# Patient Record
Sex: Male | Born: 1975 | Race: Black or African American | Hispanic: No | Marital: Married | Smoking: Never smoker
Health system: Southern US, Community
[De-identification: ages and names within clinical notes are randomized; demographics above are authoritative.]

---

## 2003-11-19 ENCOUNTER — Encounter: Admission: RE | Admit: 2003-11-19 | Discharge: 2003-11-19 | Payer: Self-pay | Admitting: Internal Medicine

## 2003-12-26 ENCOUNTER — Encounter: Admission: RE | Admit: 2003-12-26 | Discharge: 2003-12-26 | Payer: Self-pay | Admitting: Internal Medicine

## 2013-12-16 ENCOUNTER — Emergency Department (HOSPITAL_COMMUNITY): Payer: No Typology Code available for payment source

## 2013-12-16 ENCOUNTER — Emergency Department (HOSPITAL_COMMUNITY)
Admission: EM | Admit: 2013-12-16 | Discharge: 2013-12-16 | Disposition: A | Discharge status: Home / Self Care | Payer: No Typology Code available for payment source | Attending: Emergency Medicine | Admitting: Emergency Medicine

## 2013-12-16 ENCOUNTER — Encounter (HOSPITAL_COMMUNITY): Payer: Self-pay | Admitting: Emergency Medicine

## 2013-12-16 DIAGNOSIS — N200 Calculus of kidney: Secondary | ICD-10-CM | POA: Insufficient documentation

## 2013-12-16 DIAGNOSIS — R110 Nausea: Secondary | ICD-10-CM | POA: Insufficient documentation

## 2013-12-16 DIAGNOSIS — R61 Generalized hyperhidrosis: Secondary | ICD-10-CM | POA: Insufficient documentation

## 2013-12-16 DIAGNOSIS — R42 Dizziness and giddiness: Secondary | ICD-10-CM | POA: Insufficient documentation

## 2013-12-16 DIAGNOSIS — R1012 Left upper quadrant pain: Secondary | ICD-10-CM | POA: Insufficient documentation

## 2013-12-16 LAB — CBC WITH DIFFERENTIAL/PLATELET
BASOS ABS: 0 10*3/uL (ref 0.0–0.1)
Basophils Relative: 0 % (ref 0–1)
EOS ABS: 0 10*3/uL (ref 0.0–0.7)
EOS PCT: 1 % (ref 0–5)
HCT: 38.1 % — ABNORMAL LOW (ref 39.0–52.0)
Hemoglobin: 13.2 g/dL (ref 13.0–17.0)
Lymphocytes Relative: 17 % (ref 12–46)
Lymphs Abs: 0.9 10*3/uL (ref 0.7–4.0)
MCH: 32.4 pg (ref 26.0–34.0)
MCHC: 34.6 g/dL (ref 30.0–36.0)
MCV: 93.4 fL (ref 78.0–100.0)
MONO ABS: 0.3 10*3/uL (ref 0.1–1.0)
Monocytes Relative: 6 % (ref 3–12)
NEUTROS PCT: 76 % (ref 43–77)
Neutro Abs: 3.9 10*3/uL (ref 1.7–7.7)
Platelets: 213 10*3/uL (ref 150–400)
RBC: 4.08 MIL/uL — ABNORMAL LOW (ref 4.22–5.81)
RDW: 11.7 % (ref 11.5–15.5)
WBC: 5.1 10*3/uL (ref 4.0–10.5)

## 2013-12-16 LAB — URINALYSIS, ROUTINE W REFLEX MICROSCOPIC
BILIRUBIN URINE: NEGATIVE
GLUCOSE, UA: NEGATIVE mg/dL
Ketones, ur: NEGATIVE mg/dL
Leukocytes, UA: NEGATIVE
Nitrite: NEGATIVE
Protein, ur: NEGATIVE mg/dL
SPECIFIC GRAVITY, URINE: 1.021 (ref 1.005–1.030)
UROBILINOGEN UA: 1 mg/dL (ref 0.0–1.0)
pH: 8 (ref 5.0–8.0)

## 2013-12-16 LAB — COMPREHENSIVE METABOLIC PANEL
ALBUMIN: 4.4 g/dL (ref 3.5–5.2)
ALK PHOS: 45 U/L (ref 39–117)
ALT: 18 U/L (ref 0–53)
AST: 20 U/L (ref 0–37)
Anion gap: 12 (ref 5–15)
BILIRUBIN TOTAL: 0.5 mg/dL (ref 0.3–1.2)
BUN: 13 mg/dL (ref 6–23)
CALCIUM: 9.6 mg/dL (ref 8.4–10.5)
CHLORIDE: 102 meq/L (ref 96–112)
CO2: 25 mEq/L (ref 19–32)
Creatinine, Ser: 1.01 mg/dL (ref 0.50–1.35)
GFR calc non Af Amer: >90 mL/min (ref >90)
Glucose, Bld: 108 mg/dL — ABNORMAL HIGH (ref 70–99)
Potassium: 4.1 mEq/L (ref 3.7–5.3)
Sodium: 139 mEq/L (ref 137–147)
Total Protein: 7.6 g/dL (ref 6.0–8.3)

## 2013-12-16 LAB — URINE MICROSCOPIC-ADD ON

## 2013-12-16 LAB — LIPASE, BLOOD: Lipase: 49 U/L (ref 11–59)

## 2013-12-16 MED ORDER — OXYCODONE-ACETAMINOPHEN 5-325 MG PO TABS: 1.0000 | ORAL_TABLET | ORAL | Status: DC | PRN

## 2013-12-16 MED ORDER — KETOROLAC TROMETHAMINE 30 MG/ML IJ SOLN
30.0000 mg | Freq: Once | INTRAMUSCULAR | Status: AC
  Administered 2013-12-16: 30 mg via INTRAVENOUS
  Filled 2013-12-16: qty 1

## 2013-12-16 MED ORDER — HYDROMORPHONE HCL PF 1 MG/ML IJ SOLN
0.5000 mg | Freq: Once | INTRAMUSCULAR | Status: AC
  Administered 2013-12-16: 0.5 mg via INTRAVENOUS
  Filled 2013-12-16: qty 1

## 2013-12-16 NOTE — ED Provider Notes (Signed)
CSN: [id number]     Arrival date & time 12/16/13  1204 History   First MD Initiated Contact with Patient 12/16/13 1302     Chief Complaint  Patient presents with  . Abdominal Pain  . Emesis     (Consider location/radiation/quality/duration/timing/severity/associated sxs/prior Treatment) HPI Daryl Hernandez is a 38 y.o. male who presents to ED with complaint of abdominal pain. Pt states he had an acute onset of LUQ abdominal pain onset about 4 hrs ago. Associated nausea. States pain radiated into the left flank. He denies any vomiting, no changes in his bowels. No history of similar pain in the past. No difficulty urinating. States he was sweaty, lightheaded. EMS was called. Patient received 50 mcg of fentanyl and 4 mg of Zofran with good relief in pain. Patient states he was feeling well prior to the onset of pain. He denies any fever, chills, recent illnesses, generalized malaise. He states nothing is making his pain worse. Medications received her making pain better.  History reviewed. No pertinent past medical history. History reviewed. No pertinent past surgical history. History reviewed. No pertinent family history. History  Substance Use Topics  . Smoking status: Never Smoker   . Smokeless tobacco: Not on file  . Alcohol Use: No    Review of Systems  Constitutional: Positive for diaphoresis. Negative for fever and chills.  Respiratory: Negative for cough, chest tightness and shortness of breath.   Cardiovascular: Negative for chest pain, palpitations and leg swelling.  Gastrointestinal: Positive for nausea and abdominal pain. Negative for vomiting, diarrhea and abdominal distention.  Genitourinary: Positive for flank pain. Negative for dysuria, urgency, frequency, hematuria, scrotal swelling and testicular pain.  Musculoskeletal: Negative for arthralgias, myalgias, neck pain and neck stiffness.  Skin: Negative for rash.  Allergic/Immunologic: Negative for immunocompromised  state.  Neurological: Positive for light-headedness. Negative for dizziness, weakness, numbness and headaches.  All other systems reviewed and are negative.     Allergies  Review of patient's allergies indicates no known allergies.  Home Medications   Prior to Admission medications   Medication Sig Start Date End Date Taking? Authorizing Provider  acetaminophen (TYLENOL) 500 MG tablet Take 500 mg by mouth every 6 (six) hours as needed (pain.).   Yes Historical Provider, MD   BP 115/72  Pulse 74  Temp(Src) 97.9 F (36.6 C) (Oral)  Resp 16  SpO2 98% Physical Exam  Nursing note and vitals reviewed. Constitutional: He appears well-developed and well-nourished. No distress.  HENT:  Head: Normocephalic and atraumatic.  Eyes: Conjunctivae are normal.  Neck: Neck supple.  Cardiovascular: Normal rate, regular rhythm and normal heart sounds.   Pulmonary/Chest: Effort normal. No respiratory distress. He has no wheezes. He has no rales.  Abdominal: Soft. Bowel sounds are normal. He exhibits no distension. There is no tenderness. There is no rebound and no guarding.  Left CVA tenderness. No abdominal tenderness.  Musculoskeletal: He exhibits no edema.  Neurological: He is alert.  Skin: Skin is warm and dry.    ED Course  Procedures (including critical care time) Labs Review Labs Reviewed  CBC WITH DIFFERENTIAL - Abnormal; Notable for the following:    RBC 4.08 (*)    HCT 38.1 (*)    All other components within normal limits  COMPREHENSIVE METABOLIC PANEL - Abnormal; Notable for the following:    Glucose, Bld 108 (*)    All other components within normal limits  URINALYSIS, ROUTINE W REFLEX MICROSCOPIC - Abnormal; Notable for the following:    Hgb  urine dipstick LARGE (*)    All other components within normal limits  LIPASE, BLOOD  URINE MICROSCOPIC-ADD ON    Imaging Review Ct Abdomen Pelvis Wo Contrast  12/16/2013   CLINICAL DATA:  Left flank pain.  EXAM: CT ABDOMEN AND  PELVIS WITHOUT CONTRAST  TECHNIQUE: Multidetector CT imaging of the abdomen and pelvis was performed following the standard protocol without IV contrast.  COMPARISON:  Abdomen series 7 12/2003.  FINDINGS: Liver normal. Spleen normal. Pancreas normal. No biliary distention. Gallbladder is nondistended.  Adrenals normal. Simple cyst right kidney. 6 mm stone left kidney. No evident hydronephrosis or obstructing ureteral stone. 3 mm stone in the posterior aspect of the bladder most consistent recently passed stone. Prostate is prominent.  No significant adenopathy.  Abdominal aorta normal in caliber.  Appendix normal. Mild colonic wall thickening noted about the left and transverse colon. This may be from nondistended state however mild colitis cannot be excluded. No evidence of bowel obstruction. No free air. The stomach is nondistended. Umbilical hernia with herniation of fat only. Multiple diffuse mesenteric small lymph nodes are present. These may be reactive, mesenteric adenitis could present this fashion. These are best demonstrated on image number 45/series 2.  Heart size normal.  Lung bases clear.  No acute bony abnormality.  IMPRESSION: 1. 3 mm stone noted posterior aspect of the bladder, this is consistent with recently passed stone. Associated nonobstructive left nephrolithiasis.  2. Mild wall thickening of the left and transverse colon. Although this may be from nondistended state, mild colitis cannot be excluded.  3. Prominent mesenteric lymph nodes. These may be reactive. Mesenteric adenitis could present in this fashion.   Electronically Signed   By: Maisie Fushomas  Register   On: 12/16/2013 14:18     EKG Interpretation None      MDM   Final diagnoses:  Kidney stone on left side    Patient's with left flank and left upper abdominal pain, no tenderness on exam, however he does have some CVA tenderness. The sudden onset of pain is suspicious for a kidney stone. Will get urinalysis, labs, CT abdomen  and pelvis without contrast.  3:47 PM patient CT scan is positive for 3 mm stone in the posterior aspect of the bladder, consistent with recently passed stone. Patient's pain is resolved at this time. There is also some mild wall thickening of the left transverse colon and CT scan, however patient has no evidence, no exam findings which would be consistent with colitis. Will discharge home with pain medications in case pain starts again. Followup with urology as needed. Patient is otherwise nontoxic appearing, none signs of infection of the urine. He's afebrile. Return precautions discussed  Filed Vitals:   12/16/13 1208 12/16/13 1546  BP: 115/72 113/69  Pulse: 74 92  Temp: 97.9 F (36.6 C)   TempSrc: Oral   Resp: 16 14  SpO2: 98% 99%       Lottie Musselatyana A Aloysius Heinle, PA-C 12/16/13 1548

## 2013-12-16 NOTE — ED Notes (Signed)
Bed: WA14 Expected date:  Expected time:  Means of arrival:  Comments: abd pain 

## 2013-12-16 NOTE — ED Provider Notes (Signed)
Medical screening examination/treatment/procedure(s) were performed by non-physician practitioner and as supervising physician I was immediately available for consultation/collaboration.   EKG Interpretation None       Cornelius Marullo L Noga Fogg, MD 12/16/13 1842 

## 2013-12-16 NOTE — ED Notes (Signed)
Per EMS: Pt from home.  Acute onset of LUQ pain, radiating to flank.  Tender to palp.  Diaphoretic.  Was given 50 mcg fentanyl and 4 zofran.  16 g in the lt AC.

## 2013-12-16 NOTE — Discharge Instructions (Signed)
Percocet for pain as needed. You can also take ibuprofen. If worsening or not improving, follow up with urology. Return if fever, unable to keep down medications, unable to urinate, any other new concerning symptoms.    Kidney Stones Kidney stones (urolithiasis) are deposits that form inside your kidneys. The intense pain is caused by the stone moving through the urinary tract. When the stone moves, the ureter goes into spasm around the stone. The stone is usually passed in the urine.  CAUSES   A disorder that makes certain neck glands produce too much parathyroid hormone (primary hyperparathyroidism).  A buildup of uric acid crystals, similar to gout in your joints.  Narrowing (stricture) of the ureter.  A kidney obstruction present at birth (congenital obstruction).  Previous surgery on the kidney or ureters.  Numerous kidney infections. SYMPTOMS   Feeling sick to your stomach (nauseous).  Throwing up (vomiting).  Blood in the urine (hematuria).  Pain that usually spreads (radiates) to the groin.  Frequency or urgency of urination. DIAGNOSIS   Taking a history and physical exam.  Blood or urine tests.  CT scan.  Occasionally, an examination of the inside of the urinary bladder (cystoscopy) is performed. TREATMENT   Observation.  Increasing your fluid intake.  Extracorporeal shock wave lithotripsy--This is a noninvasive procedure that uses shock waves to break up kidney stones.  Surgery may be needed if you have severe pain or persistent obstruction. There are various surgical procedures. Most of the procedures are performed with the use of small instruments. Only small incisions are needed to accommodate these instruments, so recovery time is minimized. The size, location, and chemical composition are all important variables that will determine the proper choice of action for you. Talk to your health care provider to better understand your situation so that you will  minimize the risk of injury to yourself and your kidney.  HOME CARE INSTRUCTIONS   Drink enough water and fluids to keep your urine clear or pale yellow. This will help you to pass the stone or stone fragments.  Strain all urine through the provided strainer. Keep all particulate matter and stones for your health care provider to see. The stone causing the pain may be as small as a grain of salt. It is very important to use the strainer each and every time you pass your urine. The collection of your stone will allow your health care provider to analyze it and verify that a stone has actually passed. The stone analysis will often identify what you can do to reduce the incidence of recurrences.  Only take over-the-counter or prescription medicines for pain, discomfort, or fever as directed by your health care provider.  Make a follow-up appointment with your health care provider as directed.  Get follow-up X-rays if required. The absence of pain does not always mean that the stone has passed. It may have only stopped moving. If the urine remains completely obstructed, it can cause loss of kidney function or even complete destruction of the kidney. It is your responsibility to make sure X-rays and follow-ups are completed. Ultrasounds of the kidney can show blockages and the status of the kidney. Ultrasounds are not associated with any radiation and can be performed easily in a matter of minutes. SEEK MEDICAL CARE IF:  You experience pain that is progressive and unresponsive to any pain medicine you have been prescribed. SEEK IMMEDIATE MEDICAL CARE IF:   Pain cannot be controlled with the prescribed medicine.  You have a  fever or shaking chills.  The severity or intensity of pain increases over 18 hours and is not relieved by pain medicine.  You develop a new onset of abdominal pain.  You feel faint or pass out.  You are unable to urinate. MAKE SURE YOU:   Understand these  instructions.  Will watch your condition.  Will get help right away if you are not doing well or get worse. Document Released: 04/30/2005 Document Revised: 12/31/2012 Document Reviewed: 10/01/2012 [facility] Patient Information 2015 [place], [place]. This information is not intended to replace advice given to you by your health care provider. Make sure you discuss any questions you have with your health care provider.

## 2014-12-08 ENCOUNTER — Ambulatory Visit: Payer: Self-pay

## 2015-08-05 IMAGING — CT CT ABD-PELV W/O CM
1 series · 14 of 32 positions shown, 18 images · non-contrast
Comparison: Abdomen series [DATE].

CLINICAL DATA: Left flank pain.

EXAM:
CT ABDOMEN AND PELVIS WITHOUT CONTRAST
TECHNIQUE: Multidetector CT imaging of the abdomen and pelvis was performed
following the standard protocol without IV contrast.

[Series 6: sagittal · sagittal · 0.75mm/px · 14 of 128 slices shown, 18 images]
[im 5/128  lung]
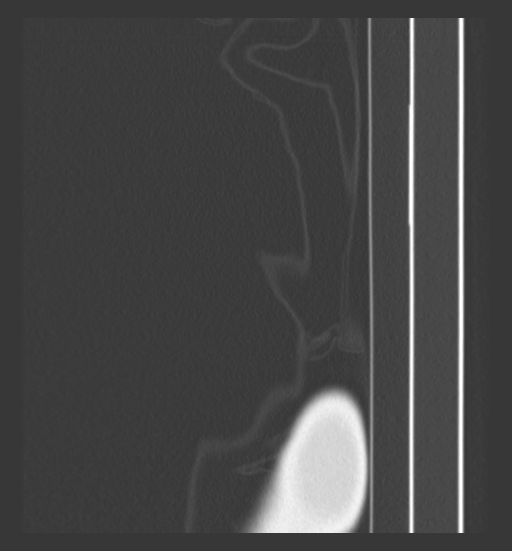
[im 9/128  soft-tissue]
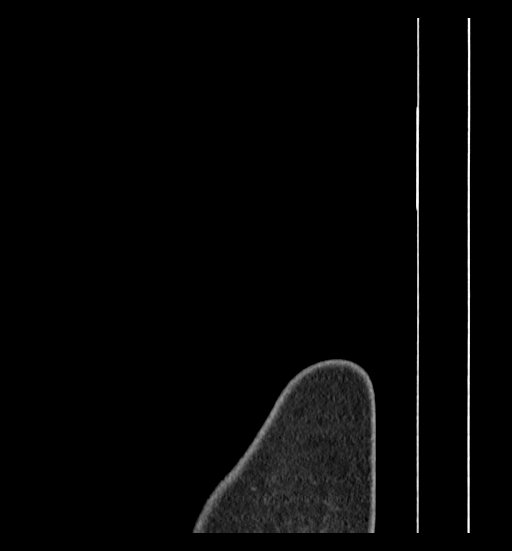
[im 9/128  lung]
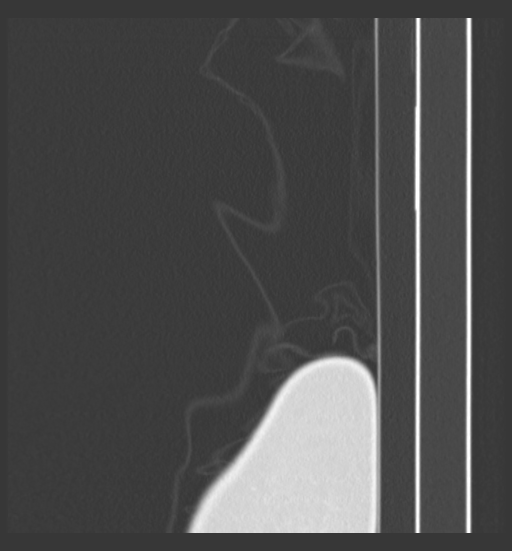
[im 9/128  bone]
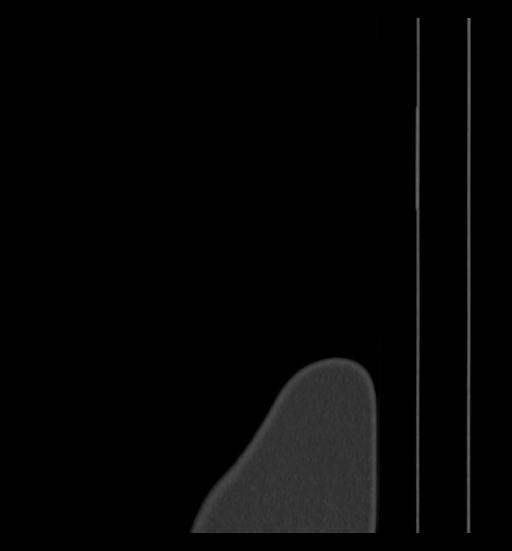
[im 13/128  lung]
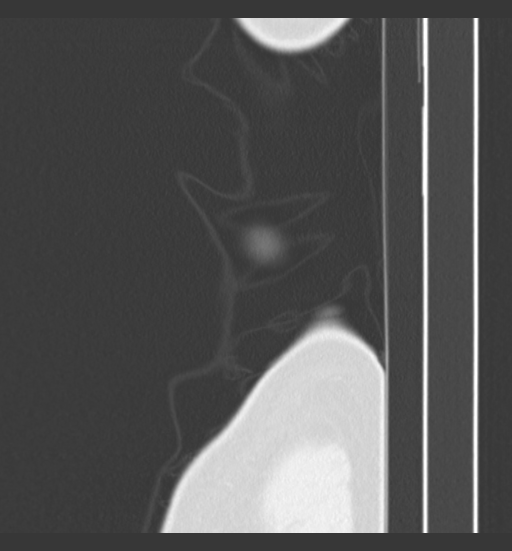
[im 17/128  soft-tissue]
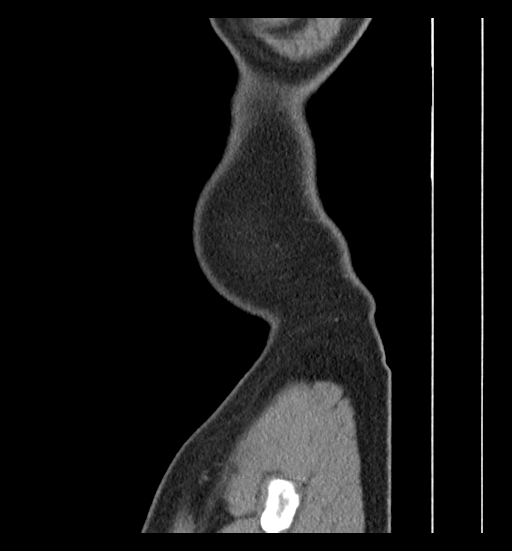
[im 17/128  lung]
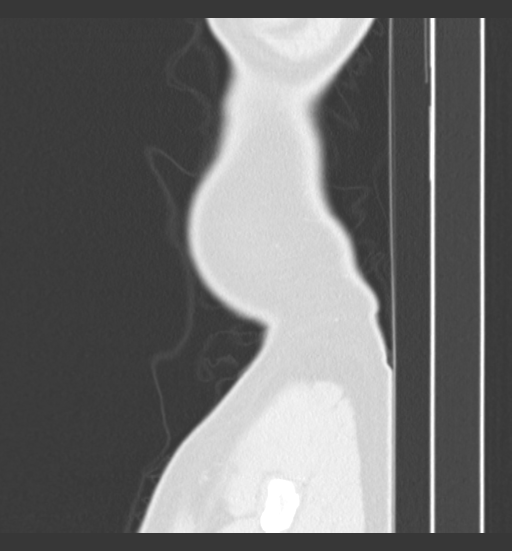
[im 29/128  soft-tissue]
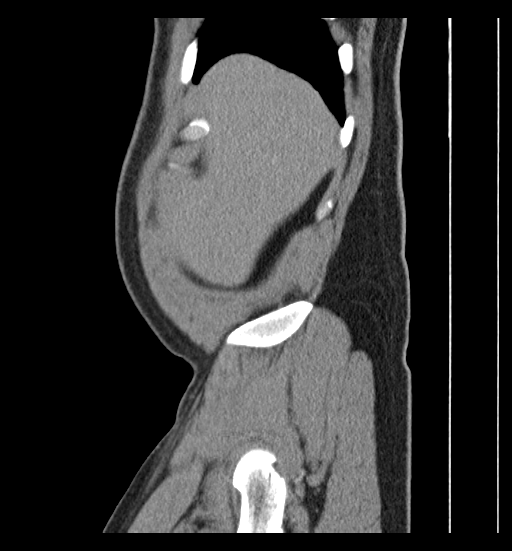
[im 37/128  soft-tissue]
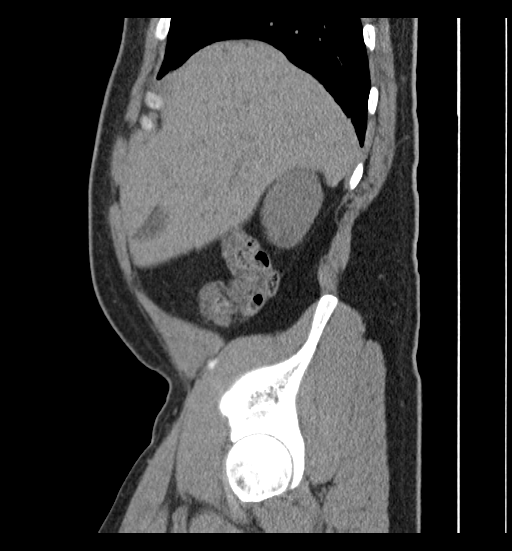
[im 50/128  soft-tissue]
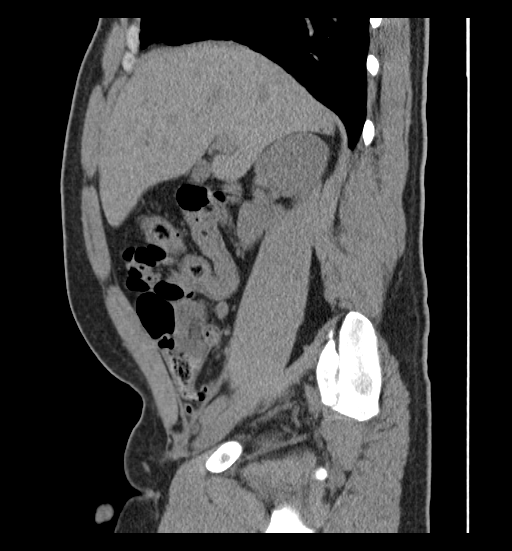
[im 58/128  soft-tissue]
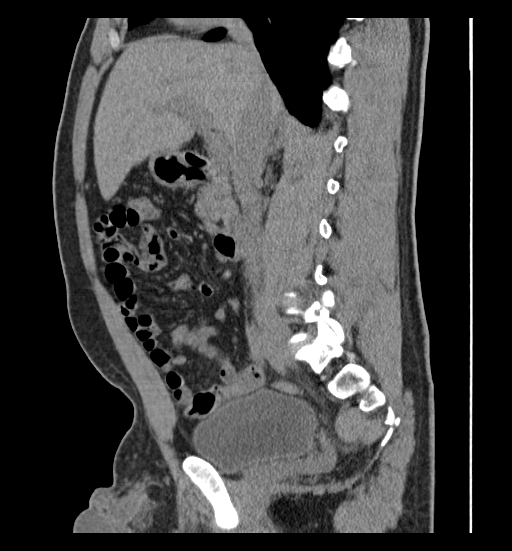
[im 70/128  soft-tissue]
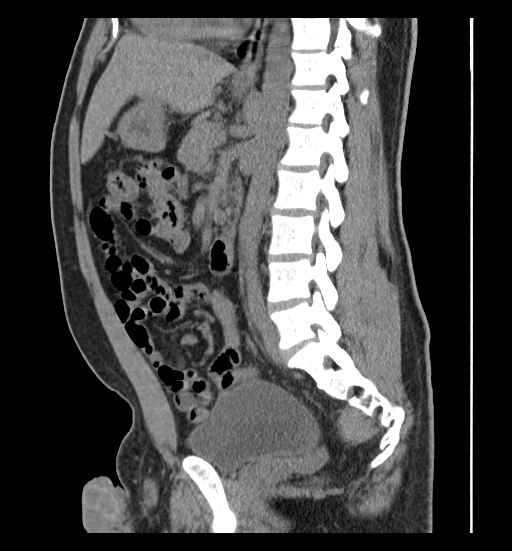
[im 78/128  soft-tissue]
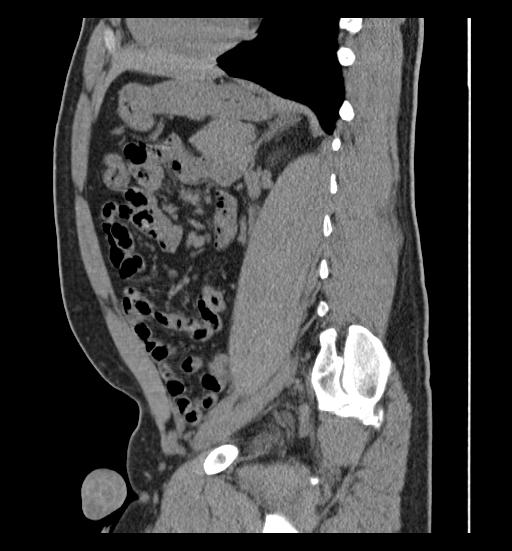
[im 91/128  soft-tissue]
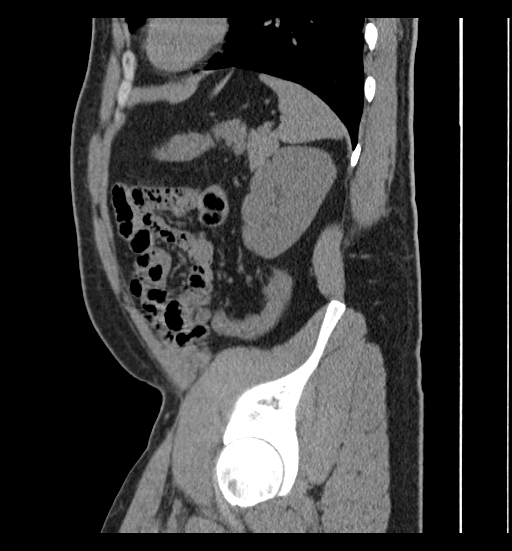
[im 91/128  bone]
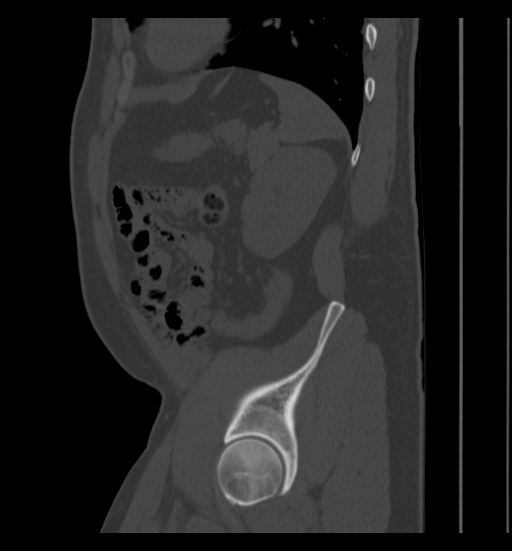
[im 99/128  soft-tissue]
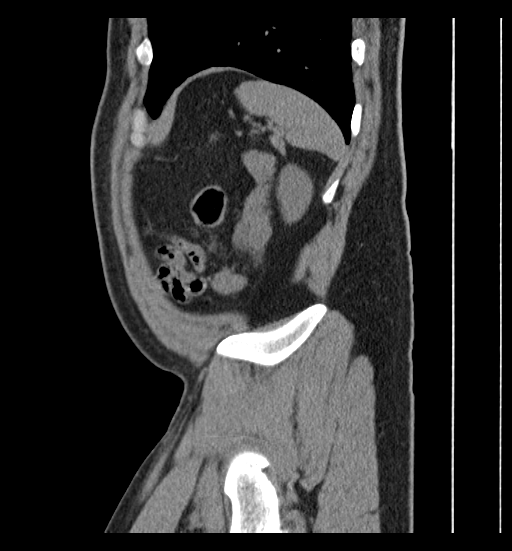
[im 111/128  soft-tissue]
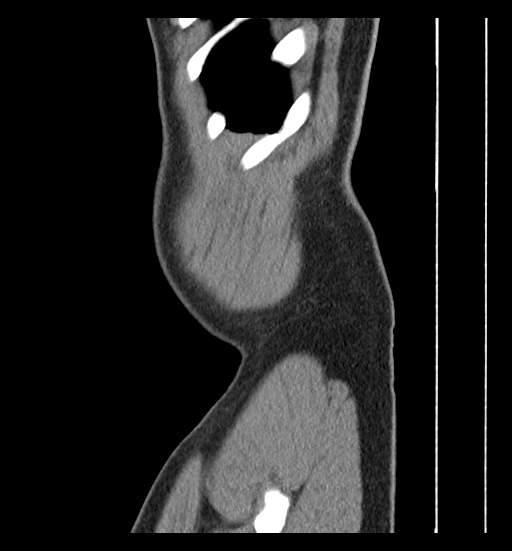
[im 119/128  soft-tissue]
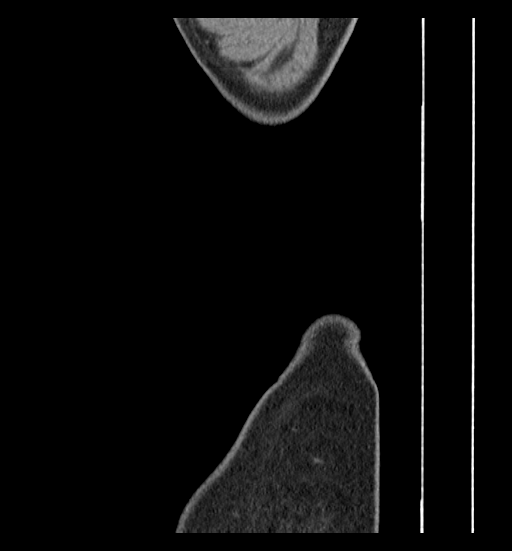

[14 of 32 positions shown; findings below may reference images not displayed]

FINDINGS: Liver normal. Spleen normal. Pancreas normal. No biliary distention.
Gallbladder is nondistended.

Adrenals normal. Simple cyst right kidney. 6 mm stone left kidney.
No evident hydronephrosis or obstructing ureteral stone. 3 mm stone
in the posterior aspect of the bladder most consistent recently
passed stone. Prostate is prominent.

No significant adenopathy.  Abdominal aorta normal in caliber.

Appendix normal. Mild colonic wall thickening noted about the left
and transverse colon. This may be from nondistended state however
mild colitis cannot be excluded. No evidence of bowel obstruction.
No free air. The stomach is nondistended. Umbilical hernia with
herniation of fat only. Multiple diffuse mesenteric small lymph
nodes are present. These may be reactive, mesenteric adenitis could
present this fashion. These are best demonstrated on image number
45/series 2.

Heart size normal.  Lung bases clear.  No acute bony abnormality.
IMPRESSION: 1. 3 mm stone noted posterior aspect of the bladder, this is
consistent with recently passed stone. Associated nonobstructive
left nephrolithiasis.

2. Mild wall thickening of the left and transverse colon. Although
this may be from nondistended state, mild colitis cannot be
excluded.

3. Prominent mesenteric lymph nodes. These may be reactive.
Mesenteric adenitis could present in this fashion.

## 2019-04-02 ENCOUNTER — Other Ambulatory Visit: Payer: Self-pay | Admitting: Cardiology

## 2019-04-02 DIAGNOSIS — Z20822 Contact with and (suspected) exposure to covid-19: Secondary | ICD-10-CM

## 2019-04-06 LAB — NOVEL CORONAVIRUS, NAA: SARS-CoV-2, NAA: NOT DETECTED

## 2020-04-18 ENCOUNTER — Encounter (HOSPITAL_COMMUNITY): Payer: Self-pay

## 2020-04-18 ENCOUNTER — Other Ambulatory Visit: Payer: Self-pay

## 2020-04-18 ENCOUNTER — Ambulatory Visit (HOSPITAL_COMMUNITY)
Admission: EM | Admit: 2020-04-18 | Discharge: 2020-04-18 | Disposition: A | Discharge status: Home / Self Care | Payer: Worker's Compensation | Attending: Internal Medicine | Admitting: Internal Medicine

## 2020-04-18 DIAGNOSIS — S63501A Unspecified sprain of right wrist, initial encounter: Secondary | ICD-10-CM

## 2020-04-18 MED ORDER — IBUPROFEN 600 MG PO TABS: 600.0000 mg | ORAL_TABLET | Freq: Four times a day (QID) | ORAL | 0 refills | Status: AC | PRN

## 2020-04-18 NOTE — Discharge Instructions (Signed)
Please take medications as prescribed Apply ice, use your wrist splint and do range of motion exercises.

## 2020-04-18 NOTE — ED Triage Notes (Signed)
Pt presents with complaints of right wrist pain. States he was throwing a trash bag that was very heavy. He feels he turned his wrist weird and it started burning. His wrist is very sore right now.

## 2020-04-20 NOTE — ED Provider Notes (Signed)
[facility]    CSN: [id number] Arrival date & time: 04/18/20  1655      History   Chief Complaint Chief Complaint  Patient presents with  . Wrist Pain    HPI Daryl Hernandez is a 44 y.o. male comes to the urgent care with complaints of right wrist pain.  Patient was throwing a trash bag away when the wrist pain started.  Trash bag was much heavier than the patient participated.  Pain is throbbing, sharp aggravated by palpation and holding the wrist in certain positions.  No swelling.  No bruising.   HPI  History reviewed. No pertinent past medical history.  There are no problems to display for this patient.   History reviewed. No pertinent surgical history.     Home Medications    Prior to Admission medications   Medication Sig Start Date End Date Taking? Authorizing Provider  acetaminophen (TYLENOL) 500 MG tablet Take 500 mg by mouth every 6 (six) hours as needed (pain.).    [provider]  ibuprofen (ADVIL) 600 MG tablet Take 1 tablet (600 mg total) by mouth every 6 (six) hours as needed. 04/18/20   LampteyBritta Mccreedy, MD    Family History Family History  Problem Relation Age of Onset  . Arthritis Mother     Social History Social History   Tobacco Use  . Smoking status: Never Smoker  . Smokeless tobacco: Never Used  Substance Use Topics  . Alcohol use: No  . Drug use: No     Allergies   Patient has no known allergies.   Review of Systems Review of Systems  Constitutional: Negative.   Respiratory: Negative.   Musculoskeletal: Positive for arthralgias and myalgias. Negative for joint swelling.  Skin: Negative.  Negative for color change, rash and wound.     Physical Exam Triage Vital Signs ED Triage Vitals  Enc Vitals Group     BP 04/18/20 1744 120/86     Pulse Rate 04/18/20 1744 70     Resp 04/18/20 1744 19     Temp 04/18/20 1744 98.1 F (36.7 C)     Temp src --      SpO2 04/18/20 1744 96 %     Weight --       Height --      Head Circumference --      Peak Flow --      Pain Score 04/18/20 1742 6     Pain Loc --      Pain Edu? --      Excl. in GC? --    No data found.  Updated Vital Signs BP 120/86   Pulse 70   Temp 98.1 F (36.7 C)   Resp 19   SpO2 96%   Visual Acuity Right Eye Distance:   Left Eye Distance:   Bilateral Distance:    Right Eye Near:   Left Eye Near:    Bilateral Near:     Physical Exam Musculoskeletal:        General: Normal range of motion.     Comments: Tenderness on palpation of the right wrist.  Tenderness on the lateral aspect.  No bruising or deformity noted.  No weakness in the intrinsic hand muscles.      UC Treatments / Results  Labs (all labs ordered are listed, but only abnormal results are displayed) Labs Reviewed - No data to display  EKG   Radiology No results found.  Procedures Procedures (including critical  care time)  Medications Ordered in UC Medications - No data to display  Initial Impression / Assessment and Plan / UC Course  I have reviewed the triage vital signs and the nursing notes.  Pertinent labs & imaging results that were available during my care of the patient were reviewed by me and considered in my medical decision making (see chart for details).     1.  Right wrist sprain: Gentle range of motion exercises Ibuprofen 600 mg every 6 hours as needed for pain If patient's pain gets worse he is advised to return to the urgent care to be reevaluated No indication for x-rays. Return precautions given. Final Clinical Impressions(s) / UC Diagnoses   Final diagnoses:  Sprain of right wrist, initial encounter     Discharge Instructions     Please take medications as prescribed Apply ice, use your wrist splint and do range of motion exercises.   ED Prescriptions    Medication Sig Dispense Auth. Provider   ibuprofen (ADVIL) 600 MG tablet Take 1 tablet (600 mg total) by mouth every 6 (six) hours as needed. 30  tablet Nkechi Linehan, Britta Mccreedy, MD     PDMP not reviewed this encounter.   Merrilee Jansky, MD 04/20/20 [phone]

## 2022-02-23 ENCOUNTER — Ambulatory Visit
Admission: RE | Admit: 2022-02-23 | Discharge: 2022-02-23 | Disposition: A | Discharge status: Home / Self Care | Payer: Worker's Compensation | Source: Ambulatory Visit | Attending: Nurse Practitioner | Admitting: Nurse Practitioner

## 2022-02-23 ENCOUNTER — Other Ambulatory Visit: Payer: Self-pay | Admitting: Nurse Practitioner

## 2022-02-23 DIAGNOSIS — R52 Pain, unspecified: Secondary | ICD-10-CM
